# Patient Record
Sex: Female | Born: 1985 | Race: Black or African American | Hispanic: No | Marital: Single | Smoking: Never smoker
Health system: Southern US, Community
[De-identification: ages and names within clinical notes are randomized; demographics above are authoritative.]

## PROBLEM LIST (undated history)

## (undated) DIAGNOSIS — I10 Essential (primary) hypertension: Secondary | ICD-10-CM

---

## 2009-07-07 ENCOUNTER — Ambulatory Visit (HOSPITAL_COMMUNITY): Admission: RE | Admit: 2009-07-07 | Discharge: 2009-07-07 | Payer: Self-pay | Admitting: Obstetrics and Gynecology

## 2011-05-26 IMAGING — US US OB DETAIL+14 WK
1 series · 18 of 28 positions shown · non-contrast
Comparison: none

OBSTETRICAL ULTRASOUND:
 This ultrasound was performed in The [HOSPITAL], and the [facility] report will be stored to [REDACTED] PACS.  This report is also available in [HOSPITAL]?s accessANYware.

[Series 1: us ob detail+14 wk · 18 of 95 slices shown]
[im 1/95]
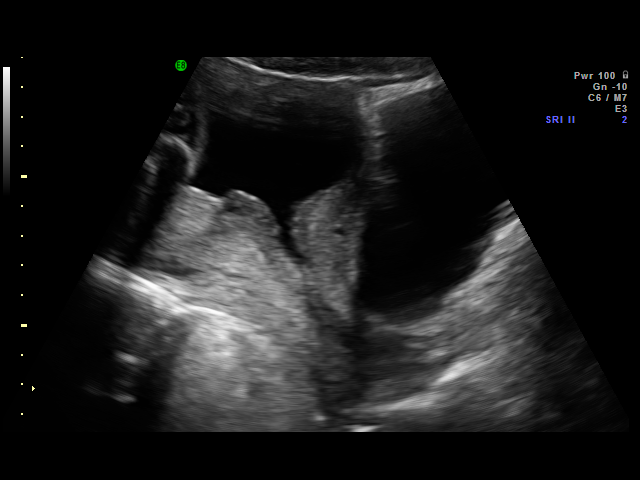
[im 7/95]
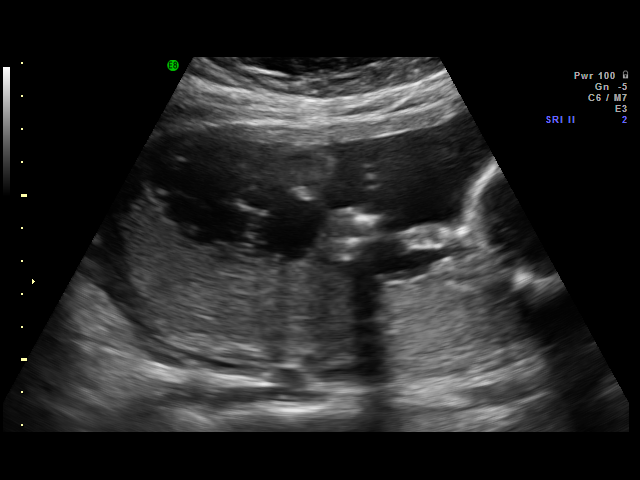
[im 11/95]
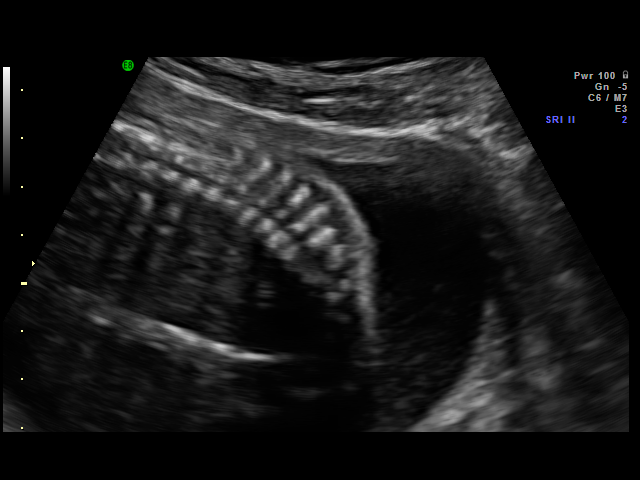
[im 18/95]
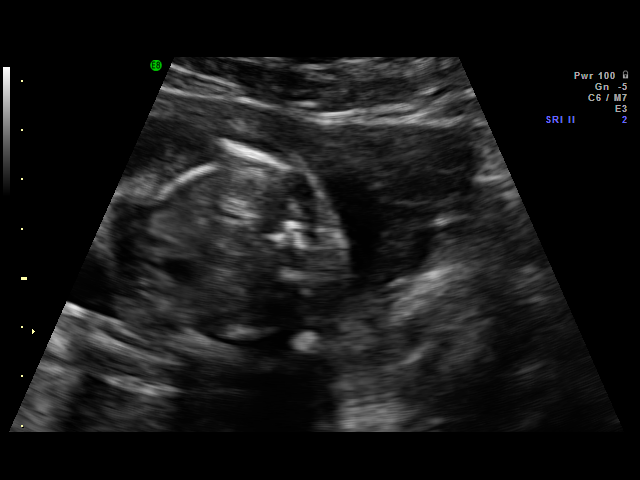
[im 25/95]
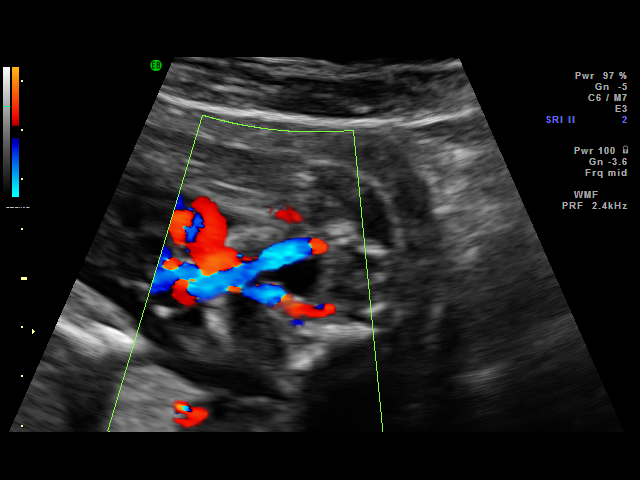
[im 28/95]
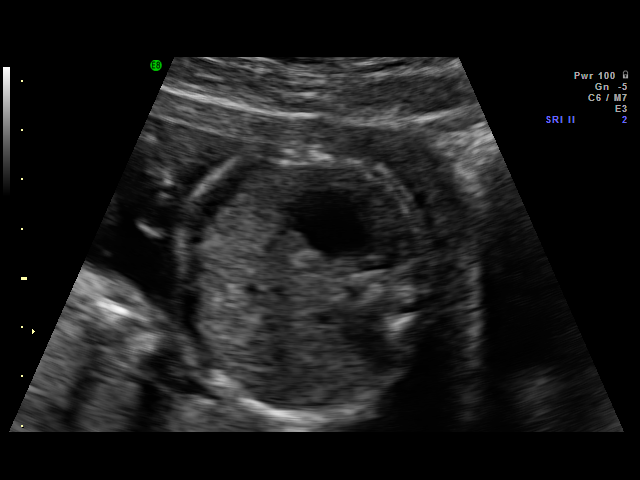
[im 35/95]
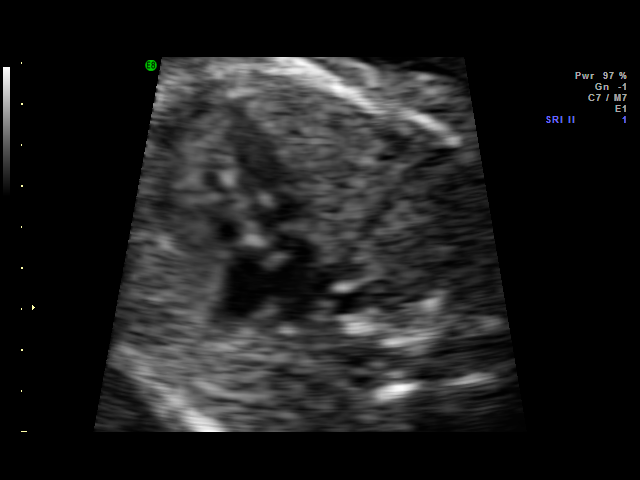
[im 39/95]
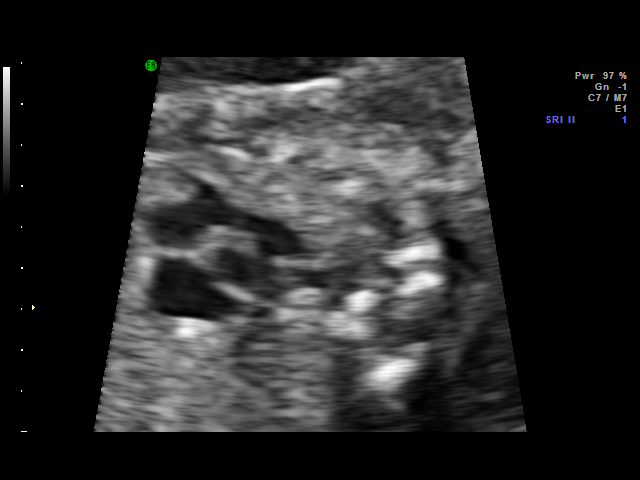
[im 46/95]
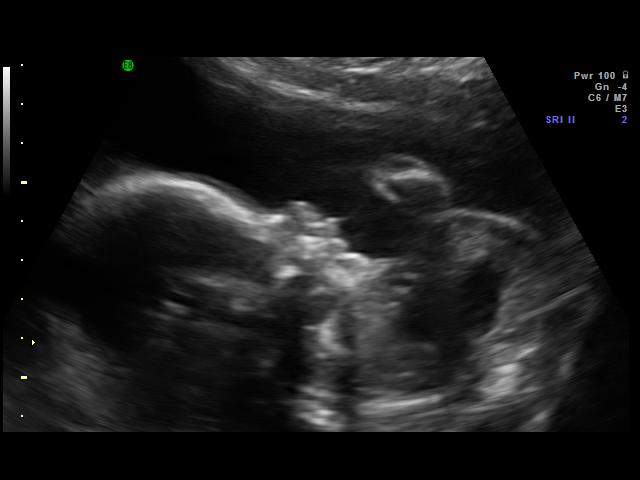
[im 49/95]
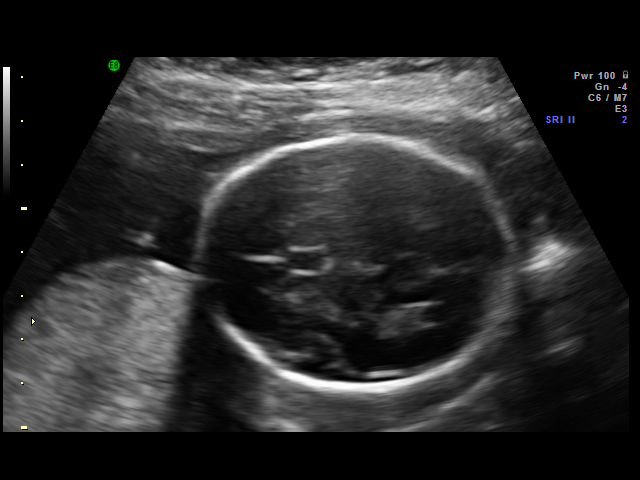
[im 56/95]
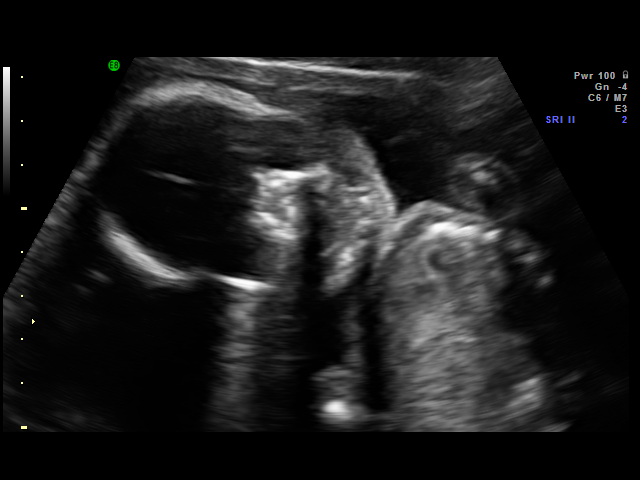
[im 60/95]
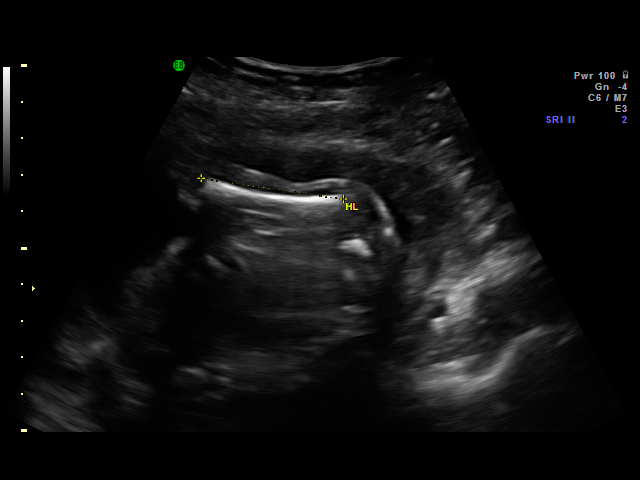
[im 67/95]
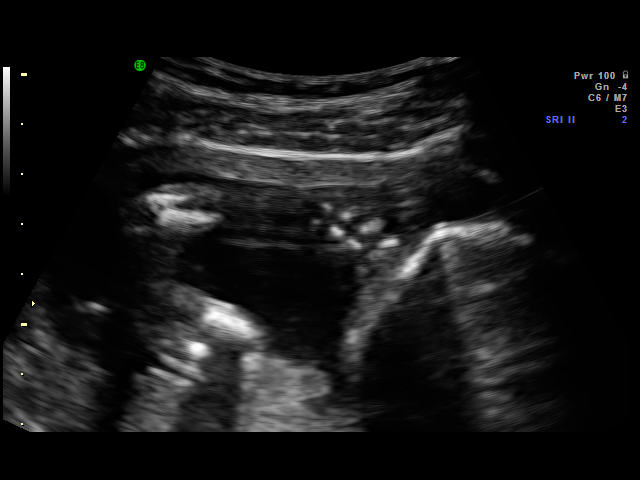
[im 74/95]
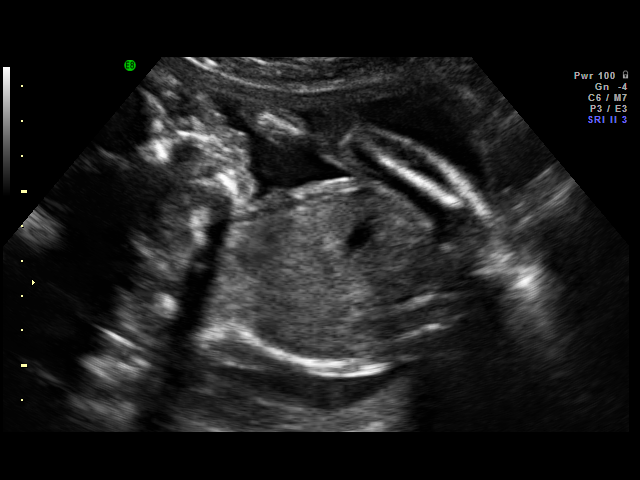
[im 77/95]
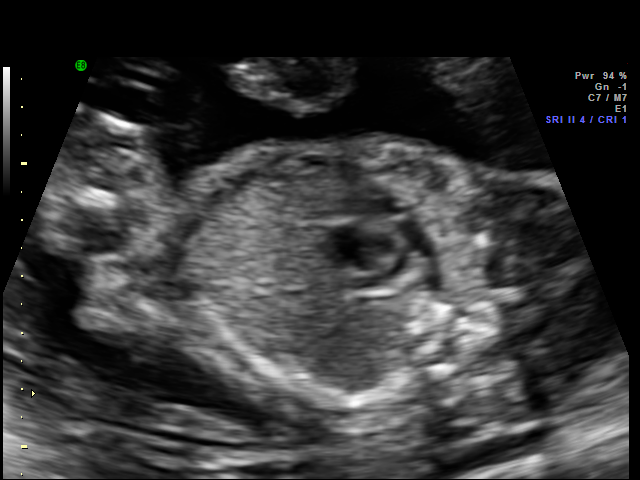
[im 84/95]
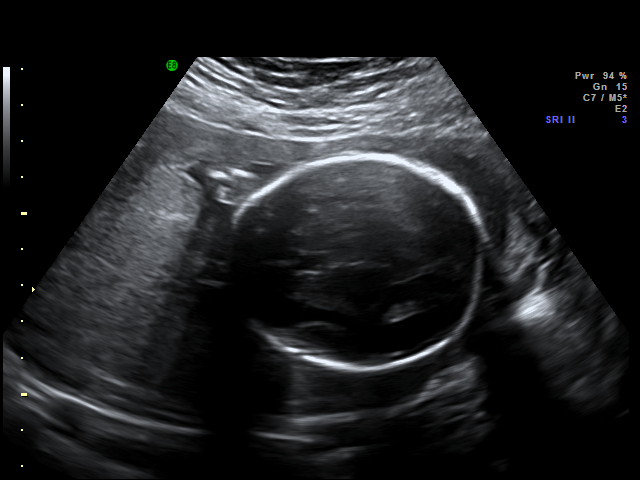
[im 88/95]
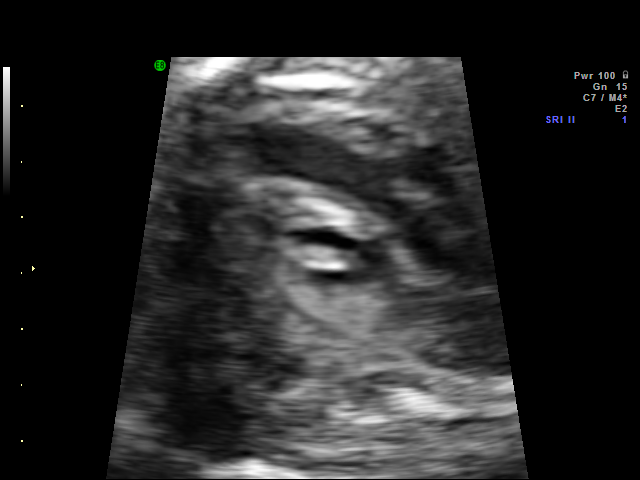
[im 95/95]
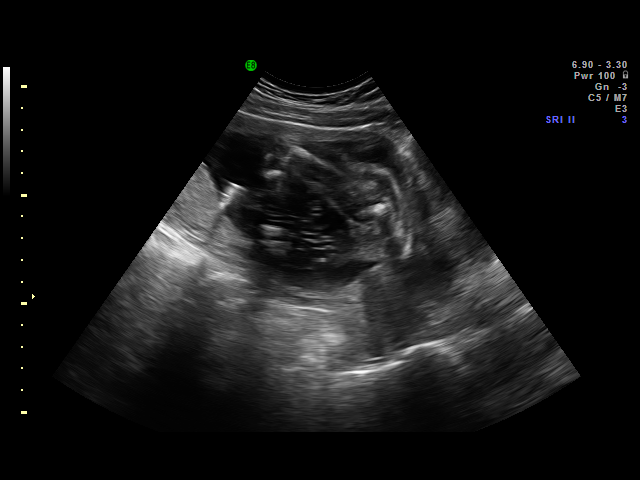

[18 of 28 positions shown; findings below may reference images not displayed]

IMPRESSION: [facility] has also been faxed to the ordering physician.

## 2013-12-30 ENCOUNTER — Encounter (HOSPITAL_COMMUNITY): Payer: Self-pay | Admitting: Emergency Medicine

## 2013-12-30 ENCOUNTER — Emergency Department (HOSPITAL_COMMUNITY)
Admission: EM | Admit: 2013-12-30 | Discharge: 2013-12-30 | Disposition: A | Discharge status: Home / Self Care | Payer: No Typology Code available for payment source | Attending: Emergency Medicine | Admitting: Emergency Medicine

## 2013-12-30 DIAGNOSIS — R519 Headache, unspecified: Secondary | ICD-10-CM

## 2013-12-30 DIAGNOSIS — R51 Headache: Secondary | ICD-10-CM | POA: Diagnosis present

## 2013-12-30 DIAGNOSIS — Z79899 Other long term (current) drug therapy: Secondary | ICD-10-CM | POA: Diagnosis not present

## 2013-12-30 DIAGNOSIS — I10 Essential (primary) hypertension: Secondary | ICD-10-CM | POA: Diagnosis not present

## 2013-12-30 DIAGNOSIS — R0981 Nasal congestion: Secondary | ICD-10-CM | POA: Diagnosis not present

## 2013-12-30 HISTORY — DX: Essential (primary) hypertension: I10

## 2013-12-30 MED ORDER — AMOXICILLIN-POT CLAVULANATE 875-125 MG PO TABS: 1.0000 | ORAL_TABLET | Freq: Two times a day (BID) | ORAL | Status: AC

## 2013-12-30 MED ORDER — METOCLOPRAMIDE HCL 5 MG/ML IJ SOLN
10.0000 mg | Freq: Once | INTRAMUSCULAR | Status: AC
  Administered 2013-12-30: 10 mg via INTRAVENOUS
  Filled 2013-12-30: qty 2

## 2013-12-30 MED ORDER — SODIUM CHLORIDE 0.9 % IV SOLN
Freq: Once | INTRAVENOUS | Status: AC
  Administered 2013-12-30: 15:00:00 via INTRAVENOUS

## 2013-12-30 MED ORDER — DIPHENHYDRAMINE HCL 50 MG/ML IJ SOLN
25.0000 mg | Freq: Once | INTRAMUSCULAR | Status: AC
  Administered 2013-12-30: 25 mg via INTRAVENOUS
  Filled 2013-12-30: qty 1

## 2013-12-30 MED ORDER — SUMATRIPTAN SUCCINATE 100 MG PO TABS: 100.0000 mg | ORAL_TABLET | ORAL | Status: AC | PRN

## 2013-12-30 MED ORDER — METHYLPREDNISOLONE SODIUM SUCC 125 MG IJ SOLR
125.0000 mg | Freq: Once | INTRAMUSCULAR | Status: AC
  Administered 2013-12-30: 125 mg via INTRAVENOUS
  Filled 2013-12-30: qty 2

## 2013-12-30 NOTE — ED Provider Notes (Signed)
Medical screening examination/treatment/procedure(s) were performed by non-physician practitioner and as supervising physician I was immediately available for consultation/collaboration.  Flint MelterElliott L Ezrie Bunyan, MD 12/30/13 [phone]

## 2013-12-30 NOTE — ED Notes (Signed)
Complains of headache, last thursady seen at [facility] for same and dx with sinusitis, sts symptoms continue and also reprots htn.

## 2013-12-30 NOTE — Discharge Instructions (Signed)
Headache and Arthritis Headaches and arthritis are common problems. This causes an interest in the possible role of arthritis in causing headaches. Several major forms of arthritis exist. Two of the most common types are:  Rheumatoid arthritis.  Osteoarthritis. Rheumatoid arthritis may begin at any age. It is a condition in which the body attacks some of its own tissues, thinking they do not belong. This leads to destruction of the bony areas around the joints. This condition may afflict any of the body's joints. It usually produces a deformity of the joint. The hands and fingers no longer appear straight but often appear angled towards one side. In some cases, the spine may be involved. Most often it is the vertebrae of the neck (cervical spine). The areas of the neck most commonly afflicted by rheumatoid arthritis are the first and second cervical vertebrae. Curiously, rheumatoid arthritis, though it often produces severe deformities, is not always painful.  The more common form of arthritis is osteoarthritis. It is a wear-and-tear form of arthritis. It usually does not produce deformity of the joints or destruction of the bony tissues. Rather the ligaments weaken. They may be calcified due to the body's attempt to heal the damage. The larger joints of the body and those joints that take the most stress and strain are the most often affected. In the neck region this osteoarthritis usually involves the fifth, sixth and seventh vertebrae. This is because the effects of posture produce the most fatigue on them. Osteoarthritis is often more painful than rheumatoid arthritis.  During workups for arthritis, a test evaluating inflammation, (the sedimentation rate) often is performed. In rheumatoid arthritis, this test will usually be elevated. Other tests for inflammation may also be elevated. In patients with osteoarthritis, x-rays of the neck or jaw joints will show changes from "lipping" of the vertebrae. This  is caused by calcium deposits in the ligaments. Or they may show narrowing of the space between the vertebrae, or spur formation (from calcium deposits). If severe, it may cause obstruction of the holes where the nerves pass from the spine to the body. In rheumatoid arthritis, dislocation of vertebrae may occur in the upper neck. CT scan and MRI in patients with osteoarthritis may show bulging of the discs that cushion the vertebrae. In the most severe cases, herniation of the discs may occur.  Headaches, felt as a pain in the neck, may be caused by arthritis if the first, second or third vertebrae are involved. This condition is due to the nerves that supply the scalp only originating from this area of the spine. Neck pain itself, whether alone or coupled with headaches, can involve any portion of the neck. If the jaw is involved, the symptoms are similar to those of Temporomandibular Joint Syndrome (TMJ).  The progressive severity of rheumatoid arthritis may be slowed by a variety of potent medications. In osteoarthritis, its progression is not usually hindered by medication. The following may be helpful in slowing the advancement of the disorder:  Lifestyle adjustment.  Exercise.  Rest.  Weight loss. Medications, such as the nonsteroidal anti-inflammatory agents (NSAIDs), are useful. They may reduce the pain and improve the reduced motion which occurs in joints afflicted by arthritis. From some studies, the use of acetaminophen appears to be as effective in controlling the pain of arthritis as the NSAIDs. Physical modalities may also be useful for arthritis. They include:  Heat.  Massage.  Exercise. But physical therapy must be prescribed by a caregiver, just as most medications  for arthritis.  Document Released: 05/11/2003 Document Revised: 02/23/2013 Document Reviewed: 05/24/2013 [facility] CenterExitCare Patient Information 2015 [place], [place]. This information is not intended to replace advice given to  you by your health care provider. Make sure you discuss any questions you have with your health care provider. Sinusitis Sinusitis is redness, soreness, and inflammation of the paranasal sinuses. Paranasal sinuses are air pockets within the bones of your face (beneath the eyes, the middle of the forehead, or above the eyes). In healthy paranasal sinuses, mucus is able to drain out, and air is able to circulate through them by way of your nose. However, when your paranasal sinuses are inflamed, mucus and air can become trapped. This can allow bacteria and other germs to grow and cause infection. Sinusitis can develop quickly and last only a short time (acute) or continue over a long period (chronic). Sinusitis that lasts for more than 12 weeks is considered chronic.  CAUSES  Causes of sinusitis include:  Allergies.  Structural abnormalities, such as displacement of the cartilage that separates your nostrils (deviated septum), which can decrease the air flow through your nose and sinuses and affect sinus drainage.  Functional abnormalities, such as when the small hairs (cilia) that line your sinuses and help remove mucus do not work properly or are not present. SIGNS AND SYMPTOMS  Symptoms of acute and chronic sinusitis are the same. The primary symptoms are pain and pressure around the affected sinuses. Other symptoms include:  Upper toothache.  Earache.  Headache.  Bad breath.  Decreased sense of smell and taste.  A cough, which worsens when you are lying flat.  Fatigue.  Fever.  Thick drainage from your nose, which often is green and may contain pus (purulent).  Swelling and warmth over the affected sinuses. DIAGNOSIS  Your health care provider will perform a physical exam. During the exam, your health care provider may:  Look in your nose for signs of abnormal growths in your nostrils (nasal polyps).  Tap over the affected sinus to check for signs of infection.  View the  inside of your sinuses (endoscopy) using an imaging device that has a light attached (endoscope). If your health care provider suspects that you have chronic sinusitis, one or more of the following tests may be recommended:  Allergy tests.  Nasal culture. A sample of mucus is taken from your nose, sent to a lab, and screened for bacteria.  Nasal cytology. A sample of mucus is taken from your nose and examined by your health care provider to determine if your sinusitis is related to an allergy. TREATMENT  Most cases of acute sinusitis are related to a viral infection and will resolve on their own within 10 days. Sometimes medicines are prescribed to help relieve symptoms (pain medicine, decongestants, nasal steroid sprays, or saline sprays).  However, for sinusitis related to a bacterial infection, your health care provider will prescribe antibiotic medicines. These are medicines that will help kill the bacteria causing the infection.  Rarely, sinusitis is caused by a fungal infection. In theses cases, your health care provider will prescribe antifungal medicine. For some cases of chronic sinusitis, surgery is needed. Generally, these are cases in which sinusitis recurs more than 3 times per year, despite other treatments. HOME CARE INSTRUCTIONS   Drink plenty of water. Water helps thin the mucus so your sinuses can drain more easily.  Use a humidifier.  Inhale steam 3 to 4 times a day (for example, sit in the bathroom with the shower  running).  Apply a warm, moist washcloth to your face 3 to 4 times a day, or as directed by your health care provider.  Use saline nasal sprays to help moisten and clean your sinuses.  Take medicines only as directed by your health care provider.  If you were prescribed either an antibiotic or antifungal medicine, finish it all even if you start to feel better. SEEK IMMEDIATE MEDICAL CARE IF:  You have increasing pain or severe headaches.  You have  nausea, vomiting, or drowsiness.  You have swelling around your face.  You have vision problems.  You have a stiff neck.  You have difficulty breathing. MAKE SURE YOU:   Understand these instructions.  Will watch your condition.  Will get help right away if you are not doing well or get worse. Document Released: 02/18/2005 Document Revised: 07/05/2013 Document Reviewed: 03/05/2011 [facility] LtdExitCare Patient Information 2015 [place]Care, [place]. This information is not intended to replace advice given to you by your health care provider. Make sure you discuss any questions you have with your health care provider.

## 2013-12-30 NOTE — ED Provider Notes (Signed)
CSN: [id number]     Arrival date & time 12/30/13  1108 History   First MD Initiated Contact with Patient 12/30/13 1353     Chief Complaint  Patient presents with  . Headache     (Consider location/radiation/quality/duration/timing/severity/associated sxs/prior Treatment) Patient is a 28 y.o. female presenting with headaches. The history is provided by the patient. No language interpreter was used.  Headache Pain location:  Frontal Quality:  Stabbing Radiates to:  Does not radiate Severity currently:  9/10 Severity at highest:  9/10 Timing:  Constant Progression:  Worsening Chronicity:  New Similar to prior headaches: no   Context: activity   Relieved by:  Nothing Worsened by:  Nothing tried Ineffective treatments:  None tried Associated symptoms: congestion   Associated symptoms: no fever    Pt treated for sinus infection and headache.  Pt reports no improvement with 6 days of percocet and amoxicillian.   Past Medical History  Diagnosis Date  . Hypertension    History reviewed. No pertinent past surgical history. History reviewed. No pertinent family history. History  Substance Use Topics  . Smoking status: Never Smoker   . Smokeless tobacco: Not on file  . Alcohol Use: No   OB History   Grav Para Term Preterm Abortions TAB SAB Ect Mult Living                 Review of Systems  Constitutional: Negative for fever.  HENT: Positive for congestion.   Neurological: Positive for headaches.  All other systems reviewed and are negative.     Allergies  Ibuprofen and Tylenol  Home Medications   Prior to Admission medications   Medication Sig Start Date End Date Taking? Authorizing Provider  amoxicillin (AMOXIL) 500 MG capsule Take 500 mg by mouth 2 (two) times daily. Started on 10-23;  10 day course of therapy   Yes Historical Provider, MD  metoprolol (LOPRESSOR) 50 MG tablet Take 50 mg by mouth 2 (two) times daily.   Yes Historical Provider, MD   BP 139/68   Pulse 57  Temp(Src) 98 F (36.7 C) (Oral)  Resp 16  Ht 5\' 7"  (1.702 m)  Wt 285 lb (129.275 kg)  BMI 44.63 kg/m2  SpO2 100% Physical Exam  Nursing note and vitals reviewed. Constitutional: She is oriented to person, place, and time. She appears well-developed and well-nourished.  HENT:  Head: Normocephalic and atraumatic.  Tender bilat frontal sinuses.  Eyes: Conjunctivae and EOM are normal. Pupils are equal, round, and reactive to light.  Neck: Normal range of motion.  Pulmonary/Chest: Effort normal.  Abdominal: She exhibits no distension.  Musculoskeletal: Normal range of motion.  Neurological: She is alert and oriented to person, place, and time.  Psychiatric: She has a normal mood and affect.    ED Course  Procedures (including critical care time) Labs Review Labs Reviewed - No data to display  Imaging Review No results found.   EKG Interpretation None      MDM   Final diagnoses:  Headache, unspecified headache type    Augmentin Imitrex See your Physicain for recheck in 1 week if symptoms persist    Elson AreasLeslie K Ki Corbo, PA-C 12/30/13 1600

## 2015-10-23 DIAGNOSIS — R300 Dysuria: Secondary | ICD-10-CM | POA: Diagnosis present

## 2015-10-23 DIAGNOSIS — Z5321 Procedure and treatment not carried out due to patient leaving prior to being seen by health care provider: Secondary | ICD-10-CM | POA: Insufficient documentation

## 2015-10-24 ENCOUNTER — Encounter (HOSPITAL_COMMUNITY): Payer: Self-pay | Admitting: Emergency Medicine

## 2015-10-24 ENCOUNTER — Emergency Department (HOSPITAL_COMMUNITY)
Admission: EM | Admit: 2015-10-24 | Discharge: 2015-10-24 | Disposition: A | Discharge status: Home / Self Care | Payer: Medicaid Other | Attending: Dermatology | Admitting: Dermatology

## 2015-10-24 NOTE — ED Notes (Signed)
Called for patient, no response

## 2015-10-24 NOTE — ED Triage Notes (Signed)
Pt states that she is here for a pregnancy test but then states that she also has dysuria too. Alert and oriented.
